# Patient Record
Sex: Male | Born: 1980
Health system: Southern US, Community
[De-identification: ages and names within clinical notes are randomized; demographics above are authoritative.]

## PROBLEM LIST (undated history)

## (undated) DIAGNOSIS — W3400XA Accidental discharge from unspecified firearms or gun, initial encounter: Secondary | ICD-10-CM

---

## 2000-04-25 ENCOUNTER — Emergency Department (HOSPITAL_COMMUNITY): Admission: EM | Admit: 2000-04-25 | Discharge: 2000-04-25 | Payer: Self-pay | Admitting: Emergency Medicine

## 2000-12-07 ENCOUNTER — Emergency Department (HOSPITAL_COMMUNITY): Admission: EM | Admit: 2000-12-07 | Discharge: 2000-12-07 | Payer: Self-pay | Admitting: Internal Medicine

## 2001-01-12 ENCOUNTER — Emergency Department (HOSPITAL_COMMUNITY): Admission: EM | Admit: 2001-01-12 | Discharge: 2001-01-12 | Payer: Self-pay | Admitting: Emergency Medicine

## 2003-11-19 ENCOUNTER — Emergency Department (HOSPITAL_COMMUNITY): Admission: EM | Admit: 2003-11-19 | Discharge: 2003-11-19 | Payer: Self-pay | Admitting: Emergency Medicine

## 2003-11-21 ENCOUNTER — Emergency Department (HOSPITAL_COMMUNITY): Admission: EM | Admit: 2003-11-21 | Discharge: 2003-11-21 | Payer: Self-pay | Admitting: Emergency Medicine

## 2003-11-27 ENCOUNTER — Emergency Department (HOSPITAL_COMMUNITY): Admission: EM | Admit: 2003-11-27 | Discharge: 2003-11-27 | Payer: Self-pay | Admitting: Family Medicine

## 2003-12-24 ENCOUNTER — Emergency Department (HOSPITAL_COMMUNITY): Admission: EM | Admit: 2003-12-24 | Discharge: 2003-12-24 | Payer: Self-pay | Admitting: Internal Medicine

## 2003-12-31 ENCOUNTER — Emergency Department (HOSPITAL_COMMUNITY): Admission: EM | Admit: 2003-12-31 | Discharge: 2003-12-31 | Payer: Self-pay | Admitting: Emergency Medicine

## 2004-01-18 ENCOUNTER — Ambulatory Visit (HOSPITAL_BASED_OUTPATIENT_CLINIC_OR_DEPARTMENT_OTHER): Admission: RE | Admit: 2004-01-18 | Discharge: 2004-01-18 | Payer: Self-pay | Admitting: Otolaryngology

## 2004-01-18 ENCOUNTER — Ambulatory Visit (HOSPITAL_COMMUNITY): Admission: RE | Admit: 2004-01-18 | Discharge: 2004-01-18 | Payer: Self-pay | Admitting: Otolaryngology

## 2004-02-10 ENCOUNTER — Emergency Department (HOSPITAL_COMMUNITY): Admission: EM | Admit: 2004-02-10 | Discharge: 2004-02-10 | Payer: Self-pay

## 2004-03-25 ENCOUNTER — Emergency Department (HOSPITAL_COMMUNITY): Admission: EM | Admit: 2004-03-25 | Discharge: 2004-03-25 | Payer: Self-pay | Admitting: Family Medicine

## 2004-06-01 ENCOUNTER — Emergency Department (HOSPITAL_COMMUNITY): Admission: EM | Admit: 2004-06-01 | Discharge: 2004-06-01 | Payer: Self-pay | Admitting: Family Medicine

## 2004-08-17 ENCOUNTER — Inpatient Hospital Stay (HOSPITAL_COMMUNITY): Admission: AC | Admit: 2004-08-17 | Discharge: 2004-08-22 | Payer: Self-pay

## 2004-08-28 ENCOUNTER — Emergency Department (HOSPITAL_COMMUNITY): Admission: EM | Admit: 2004-08-28 | Discharge: 2004-08-28 | Payer: Self-pay | Admitting: Emergency Medicine

## 2004-08-29 ENCOUNTER — Ambulatory Visit (HOSPITAL_COMMUNITY): Admission: RE | Admit: 2004-08-29 | Discharge: 2004-08-29 | Payer: Self-pay | Admitting: Physician Assistant

## 2004-09-23 ENCOUNTER — Emergency Department (HOSPITAL_COMMUNITY): Admission: EM | Admit: 2004-09-23 | Discharge: 2004-09-23 | Payer: Self-pay | Admitting: Emergency Medicine

## 2004-10-29 ENCOUNTER — Emergency Department (HOSPITAL_COMMUNITY): Admission: EM | Admit: 2004-10-29 | Discharge: 2004-10-29 | Payer: Self-pay | Admitting: Family Medicine
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
## 2010-06-29 ENCOUNTER — Emergency Department (HOSPITAL_COMMUNITY)
Admission: EM | Admit: 2010-06-29 | Discharge: 2010-06-29 | Disposition: A | Discharge status: Home / Self Care | Payer: Self-pay | Attending: Emergency Medicine | Admitting: Emergency Medicine

## 2010-06-29 DIAGNOSIS — Y9229 Other specified public building as the place of occurrence of the external cause: Secondary | ICD-10-CM | POA: Insufficient documentation

## 2010-06-29 DIAGNOSIS — S0990XA Unspecified injury of head, initial encounter: Secondary | ICD-10-CM | POA: Insufficient documentation

## 2010-06-29 DIAGNOSIS — S0180XA Unspecified open wound of other part of head, initial encounter: Secondary | ICD-10-CM | POA: Insufficient documentation

## 2010-07-05 ENCOUNTER — Emergency Department (HOSPITAL_COMMUNITY)
Admission: EM | Admit: 2010-07-05 | Discharge: 2010-07-05 | Disposition: A | Discharge status: Home / Self Care | Payer: Self-pay | Attending: Emergency Medicine | Admitting: Emergency Medicine

## 2010-07-21 ENCOUNTER — Emergency Department (HOSPITAL_COMMUNITY)
Admission: EM | Admit: 2010-07-21 | Discharge: 2010-07-21 | Disposition: A | Discharge status: Home / Self Care | Payer: Self-pay | Attending: Emergency Medicine | Admitting: Emergency Medicine

## 2010-07-21 ENCOUNTER — Emergency Department (HOSPITAL_COMMUNITY): Payer: Self-pay

## 2010-07-21 DIAGNOSIS — Y92009 Unspecified place in unspecified non-institutional (private) residence as the place of occurrence of the external cause: Secondary | ICD-10-CM | POA: Insufficient documentation

## 2010-07-21 DIAGNOSIS — S99929A Unspecified injury of unspecified foot, initial encounter: Secondary | ICD-10-CM | POA: Insufficient documentation

## 2010-07-21 DIAGNOSIS — X500XXA Overexertion from strenuous movement or load, initial encounter: Secondary | ICD-10-CM | POA: Insufficient documentation

## 2010-07-21 DIAGNOSIS — S8990XA Unspecified injury of unspecified lower leg, initial encounter: Secondary | ICD-10-CM | POA: Insufficient documentation

## 2010-07-21 DIAGNOSIS — Y9372 Activity, wrestling: Secondary | ICD-10-CM | POA: Insufficient documentation

## 2010-07-21 DIAGNOSIS — M2390 Unspecified internal derangement of unspecified knee: Secondary | ICD-10-CM | POA: Insufficient documentation

## 2010-07-21 DIAGNOSIS — M25469 Effusion, unspecified knee: Secondary | ICD-10-CM | POA: Insufficient documentation

## 2011-01-06 ENCOUNTER — Emergency Department (HOSPITAL_COMMUNITY)
Admission: EM | Admit: 2011-01-06 | Discharge: 2011-01-07 | Disposition: A | Discharge status: Home / Self Care | Payer: Self-pay | Attending: Emergency Medicine | Admitting: Emergency Medicine

## 2011-01-06 DIAGNOSIS — S01409A Unspecified open wound of unspecified cheek and temporomandibular area, initial encounter: Secondary | ICD-10-CM | POA: Insufficient documentation

## 2011-01-07 ENCOUNTER — Emergency Department (HOSPITAL_COMMUNITY): Payer: Self-pay

## 2018-05-26 ENCOUNTER — Encounter (HOSPITAL_COMMUNITY): Payer: Self-pay | Admitting: Emergency Medicine

## 2018-05-26 ENCOUNTER — Ambulatory Visit (HOSPITAL_COMMUNITY)
Admission: EM | Admit: 2018-05-26 | Discharge: 2018-05-26 | Disposition: A | Discharge status: Home / Self Care | Payer: Self-pay | Attending: Family Medicine | Admitting: Family Medicine

## 2018-05-26 DIAGNOSIS — J111 Influenza due to unidentified influenza virus with other respiratory manifestations: Secondary | ICD-10-CM | POA: Insufficient documentation

## 2018-05-26 MED ORDER — OSELTAMIVIR PHOSPHATE 75 MG PO CAPS: 75.0000 mg | ORAL_CAPSULE | Freq: Two times a day (BID) | ORAL | 0 refills | Status: DC

## 2018-05-26 NOTE — ED Provider Notes (Signed)
[facility]    CSN: [id number] Arrival date & time: 05/26/18  1401     History   Chief Complaint Chief Complaint  Patient presents with  . Flu-Like Symptoms    HPI Adam Phelps is a 38 y.o. male.   Adam Phelps urgent care visit #1 for this 38 year old male who complains of fever, chills, body aches, malaise and fatigue starting yesterday.  Today he states he has a tight feeling when he tries to take a deep breath.    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     History reviewed. No pertinent past medical history.  There are no active problems to display for this patient.   History reviewed. No pertinent surgical history.     Home Medications    Prior to Admission medications   Medication Sig Start Date End Date Taking? Authorizing Provider  oseltamivir (TAMIFLU) 75 MG capsule Take 1 capsule (75 mg total) by mouth every 12 (twelve) hours. 05/26/18   Elvina Sidle, MD    Family History History reviewed. No pertinent family history.  Social History Social History   Tobacco Use  . Smoking status: Never Smoker  . Smokeless tobacco: Never Used  Substance Use Topics  . Alcohol use: Not on file  . Drug use: Not on file     Allergies   Patient has no known allergies.   Review of Systems Review of Systems   Physical Exam Triage Vital Signs ED Triage Vitals  Enc Vitals Group     BP 05/26/18 1434 (!) 123/94     Pulse Rate 05/26/18 1434 89     Resp 05/26/18 1434 18     Temp 05/26/18 1434 98.7 F (37.1 C)     Temp Source 05/26/18 1434 Oral     SpO2 05/26/18 1434 100 %     Weight --      Height --      Head Circumference --      Peak Flow --      Pain Score 05/26/18 1435 2     Pain Loc --      Pain Edu? --      Excl. in GC? --    No data found.  Updated Vital Signs BP (!) 123/94 (BP Location: Left Arm)   Pulse 89   Temp 98.7 F (37.1 C) (Oral)   Resp 18   SpO2 100%    Physical  Exam Vitals signs and nursing note reviewed.  Constitutional:      Appearance: Normal appearance.  HENT:     Head: Normocephalic and atraumatic.     Right Ear: Tympanic membrane normal.     Left Ear: Tympanic membrane normal.     Nose: Nose normal.     Mouth/Throat:     Mouth: Mucous membranes are moist.     Pharynx: Oropharynx is clear.  Eyes:     Conjunctiva/sclera: Conjunctivae normal.  Neck:     Musculoskeletal: Normal range of motion and neck supple.  Cardiovascular:     Rate and Rhythm: Normal rate and regular rhythm.     Heart sounds: Normal heart sounds.  Pulmonary:     Effort: Pulmonary effort is normal.     Breath sounds: Normal breath sounds.  Musculoskeletal: Normal range of motion.  Neurological:     General: No focal deficit present.     Mental Status: He is alert.  Psychiatric:  Mood and Affect: Mood normal.      UC Treatments / Results  Labs (all labs ordered are listed, but only abnormal results are displayed) Labs Reviewed - No data to display  EKG None  Radiology No results found.  Procedures Procedures (including critical care time)  Medications Ordered in UC Medications - No data to display  Initial Impression / Assessment and Plan / UC Course  I have reviewed the triage vital signs and the nursing notes.  Pertinent labs & imaging results that were available during my care of the patient were reviewed by me and considered in my medical decision making (see chart for details).     Final Clinical Impressions(s) / UC Diagnoses   Final diagnoses:  Influenza   Discharge Instructions   None    ED Prescriptions    Medication Sig Dispense Auth. Provider   oseltamivir (TAMIFLU) 75 MG capsule Take 1 capsule (75 mg total) by mouth every 12 (twelve) hours. 10 capsule Elvina Sidle, MD     Controlled Substance Prescriptions [place] Controlled Substance Registry consulted? Not Applicable   Elvina Sidle, MD 05/26/18 [phone]

## 2018-05-26 NOTE — ED Notes (Signed)
Patient able to ambulate independently  

## 2018-05-26 NOTE — ED Triage Notes (Signed)
[facility] fever, chills, body aches, malaise and fatigue starting yesterday.  Today he states he has a tight feeling when he tries to take a deep breath.

## 2019-03-02 ENCOUNTER — Ambulatory Visit (HOSPITAL_COMMUNITY)
Admission: EM | Admit: 2019-03-02 | Discharge: 2019-03-02 | Disposition: A | Discharge status: Home / Self Care | Payer: Self-pay | Attending: Family Medicine | Admitting: Family Medicine

## 2019-03-02 ENCOUNTER — Other Ambulatory Visit: Payer: Self-pay

## 2019-03-02 DIAGNOSIS — S199XXA Unspecified injury of neck, initial encounter: Secondary | ICD-10-CM

## 2019-03-02 MED ORDER — IBUPROFEN 800 MG PO TABS: 800.0000 mg | ORAL_TABLET | Freq: Three times a day (TID) | ORAL | 0 refills | Status: DC
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
## 2019-11-30 ENCOUNTER — Encounter (HOSPITAL_COMMUNITY): Payer: Self-pay

## 2019-11-30 ENCOUNTER — Emergency Department (HOSPITAL_COMMUNITY): Payer: Self-pay

## 2019-11-30 ENCOUNTER — Inpatient Hospital Stay (HOSPITAL_COMMUNITY)
Admission: EM | Admit: 2019-11-30 | Discharge: 2019-12-03 | DRG: 200 | Disposition: A | Discharge status: Home / Self Care | Payer: Self-pay | Attending: General Surgery | Admitting: General Surgery

## 2019-11-30 DIAGNOSIS — S21132A Puncture wound without foreign body of left front wall of thorax without penetration into thoracic cavity, initial encounter: Secondary | ICD-10-CM

## 2019-11-30 DIAGNOSIS — S1980XA Other specified injuries of unspecified part of neck, initial encounter: Secondary | ICD-10-CM | POA: Diagnosis present

## 2019-11-30 DIAGNOSIS — Z23 Encounter for immunization: Secondary | ICD-10-CM

## 2019-11-30 DIAGNOSIS — J942 Hemothorax: Secondary | ICD-10-CM

## 2019-11-30 DIAGNOSIS — J984 Other disorders of lung: Secondary | ICD-10-CM | POA: Diagnosis present

## 2019-11-30 DIAGNOSIS — W3400XA Accidental discharge from unspecified firearms or gun, initial encounter: Secondary | ICD-10-CM

## 2019-11-30 DIAGNOSIS — S271XXA Traumatic hemothorax, initial encounter: Principal | ICD-10-CM | POA: Diagnosis present

## 2019-11-30 DIAGNOSIS — D62 Acute posthemorrhagic anemia: Secondary | ICD-10-CM | POA: Diagnosis present

## 2019-11-30 DIAGNOSIS — J939 Pneumothorax, unspecified: Secondary | ICD-10-CM

## 2019-11-30 DIAGNOSIS — S22059A Unspecified fracture of T5-T6 vertebra, initial encounter for closed fracture: Secondary | ICD-10-CM | POA: Diagnosis present

## 2019-11-30 DIAGNOSIS — R042 Hemoptysis: Secondary | ICD-10-CM | POA: Diagnosis present

## 2019-11-30 DIAGNOSIS — Z7289 Other problems related to lifestyle: Secondary | ICD-10-CM

## 2019-11-30 DIAGNOSIS — Y905 Blood alcohol level of 100-119 mg/100 ml: Secondary | ICD-10-CM | POA: Diagnosis present

## 2019-11-30 DIAGNOSIS — Z20822 Contact with and (suspected) exposure to covid-19: Secondary | ICD-10-CM | POA: Diagnosis present

## 2019-11-30 DIAGNOSIS — S22069A Unspecified fracture of T7-T8 vertebra, initial encounter for closed fracture: Secondary | ICD-10-CM | POA: Diagnosis present

## 2019-11-30 DIAGNOSIS — S2242XA Multiple fractures of ribs, left side, initial encounter for closed fracture: Secondary | ICD-10-CM | POA: Diagnosis present

## 2019-11-30 HISTORY — DX: Accidental discharge from unspecified firearms or gun, initial encounter: W34.00XA

## 2019-11-30 LAB — COMPREHENSIVE METABOLIC PANEL
ALT: 25 U/L (ref 0–44)
AST: 39 U/L (ref 15–41)
Albumin: 4 g/dL (ref 3.5–5.0)
Alkaline Phosphatase: 70 U/L (ref 38–126)
Anion gap: 19 — ABNORMAL HIGH (ref 5–15)
BUN: 12 mg/dL (ref 6–20)
CO2: 17 mmol/L — ABNORMAL LOW (ref 22–32)
Calcium: 9.1 mg/dL (ref 8.9–10.3)
Chloride: 106 mmol/L (ref 98–111)
Creatinine, Ser: 1.86 mg/dL — ABNORMAL HIGH (ref 0.61–1.24)
GFR calc Af Amer: 51 mL/min — ABNORMAL LOW (ref >60)
GFR calc non Af Amer: 44 mL/min — ABNORMAL LOW (ref >60)
Glucose, Bld: 153 mg/dL — ABNORMAL HIGH (ref 70–99)
Potassium: 2.6 mmol/L — CL (ref 3.5–5.1)
Sodium: 142 mmol/L (ref 135–145)
Total Bilirubin: 1 mg/dL (ref 0.3–1.2)
Total Protein: 6.9 g/dL (ref 6.5–8.1)

## 2019-11-30 LAB — CBC
HCT: 41.1 % (ref 39.0–52.0)
Hemoglobin: 13 g/dL (ref 13.0–17.0)
MCH: 33.5 pg (ref 26.0–34.0)
MCHC: 31.6 g/dL (ref 30.0–36.0)
MCV: 105.9 fL — ABNORMAL HIGH (ref 80.0–100.0)
Platelets: 317 10*3/uL (ref 150–400)
RBC: 3.88 MIL/uL — ABNORMAL LOW (ref 4.22–5.81)
RDW: 12.1 % (ref 11.5–15.5)
WBC: 9 10*3/uL (ref 4.0–10.5)
nRBC: 0 % (ref 0.0–0.2)

## 2019-11-30 LAB — ETHANOL: Alcohol, Ethyl (B): 101 mg/dL — ABNORMAL HIGH (ref <10)

## 2019-11-30 LAB — I-STAT CHEM 8, ED
BUN: 8 mg/dL (ref 6–20)
Calcium, Ion: 1.08 mmol/L — ABNORMAL LOW (ref 1.15–1.40)
Chloride: 107 mmol/L (ref 98–111)
Creatinine, Ser: 1.9 mg/dL — ABNORMAL HIGH (ref 0.61–1.24)
Glucose, Bld: 142 mg/dL — ABNORMAL HIGH (ref 70–99)
HCT: 41 % (ref 39.0–52.0)
Hemoglobin: 13.9 g/dL (ref 13.0–17.0)
Potassium: 2.5 mmol/L — CL (ref 3.5–5.1)
Sodium: 145 mmol/L (ref 135–145)
TCO2: 17 mmol/L — ABNORMAL LOW (ref 22–32)

## 2019-11-30 LAB — ABO/RH: ABO/RH(D): O POS

## 2019-11-30 LAB — PROTIME-INR
INR: 1.1 (ref 0.8–1.2)
Prothrombin Time: 13.4 seconds (ref 11.4–15.2)

## 2019-11-30 LAB — LACTIC ACID, PLASMA: Lactic Acid, Venous: 9.2 mmol/L (ref 0.5–1.9)

## 2019-11-30 MED ORDER — ENOXAPARIN SODIUM 40 MG/0.4ML ~~LOC~~ SOLN
40.0000 mg | Freq: Every day | SUBCUTANEOUS | Status: DC
  Administered 2019-12-01 – 2019-12-03 (×3): 40 mg via SUBCUTANEOUS
  Filled 2019-11-30 (×3): qty 0.4

## 2019-11-30 MED ORDER — ACETAMINOPHEN 325 MG PO TABS
650.0000 mg | ORAL_TABLET | ORAL | Status: DC | PRN
  Administered 2019-12-01: 650 mg via ORAL
  Filled 2019-11-30: qty 2

## 2019-11-30 MED ORDER — OXYCODONE HCL 5 MG PO TABS
5.0000 mg | ORAL_TABLET | ORAL | Status: DC | PRN
  Administered 2019-12-02: 5 mg via ORAL
  Filled 2019-11-30 (×2): qty 1

## 2019-11-30 MED ORDER — HYDROMORPHONE HCL 1 MG/ML IJ SOLN
1.0000 mg | INTRAMUSCULAR | Status: DC | PRN
  Administered 2019-11-30 – 2019-12-01 (×2): 1 mg via INTRAVENOUS
  Filled 2019-11-30 (×2): qty 1

## 2019-11-30 MED ORDER — TETANUS-DIPHTH-ACELL PERTUSSIS 5-2.5-18.5 LF-MCG/0.5 IM SUSP
0.5000 mL | Freq: Once | INTRAMUSCULAR | Status: AC
  Administered 2019-11-30: 0.5 mL via INTRAMUSCULAR

## 2019-11-30 MED ORDER — OXYCODONE HCL 5 MG PO TABS
10.0000 mg | ORAL_TABLET | ORAL | Status: DC | PRN
  Administered 2019-12-01 – 2019-12-03 (×8): 10 mg via ORAL
  Filled 2019-11-30 (×10): qty 2

## 2019-11-30 MED ORDER — IOHEXOL 300 MG/ML  SOLN
100.0000 mL | Freq: Once | INTRAMUSCULAR | Status: AC | PRN
  Administered 2019-11-30: 100 mL via INTRAVENOUS

## 2019-11-30 MED ORDER — ONDANSETRON HCL 4 MG/2ML IJ SOLN
4.0000 mg | Freq: Four times a day (QID) | INTRAMUSCULAR | Status: DC | PRN
  Administered 2019-11-30 – 2019-12-01 (×2): 4 mg via INTRAVENOUS
  Filled 2019-11-30 (×2): qty 2

## 2019-11-30 MED ORDER — SODIUM CHLORIDE 0.9 % IV SOLN
1000.0000 mL | INTRAVENOUS | Status: DC
  Administered 2019-11-30: 1000 mL via INTRAVENOUS

## 2019-11-30 MED ORDER — ONDANSETRON 4 MG PO TBDP: 4.0000 mg | ORAL_TABLET | Freq: Four times a day (QID) | ORAL | Status: DC | PRN

## 2019-11-30 MED ORDER — SODIUM CHLORIDE 0.9 % IV BOLUS (SEPSIS)
500.0000 mL | Freq: Once | INTRAVENOUS | Status: AC
  Administered 2019-11-30: 500 mL via INTRAVENOUS

## 2019-11-30 MED ORDER — LACTATED RINGERS IV SOLN: INTRAVENOUS | Status: DC

## 2019-11-30 NOTE — ED Notes (Signed)
Critical result, K-2.5 on ISTAT reported to Donnald Garre, MD.
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
## 2019-12-01 ENCOUNTER — Inpatient Hospital Stay (HOSPITAL_COMMUNITY): Payer: Self-pay

## 2019-12-01 LAB — CBC
HCT: 31.6 % — ABNORMAL LOW (ref 39.0–52.0)
HCT: 31.4 % — ABNORMAL LOW (ref 39.0–52.0)
Hemoglobin: 10.1 g/dL — ABNORMAL LOW (ref 13.0–17.0)
Hemoglobin: 10 g/dL — ABNORMAL LOW (ref 13.0–17.0)
MCH: 32.5 pg (ref 26.0–34.0)
MCH: 32.4 pg (ref 26.0–34.0)
MCHC: 32 g/dL (ref 30.0–36.0)
MCHC: 31.8 g/dL (ref 30.0–36.0)
MCV: 101.6 fL — ABNORMAL HIGH (ref 80.0–100.0)
MCV: 101.6 fL — ABNORMAL HIGH (ref 80.0–100.0)
Platelets: 230 10*3/uL (ref 150–400)
Platelets: 218 10*3/uL (ref 150–400)
RBC: 3.11 MIL/uL — ABNORMAL LOW (ref 4.22–5.81)
RBC: 3.09 MIL/uL — ABNORMAL LOW (ref 4.22–5.81)
RDW: 12 % (ref 11.5–15.5)
RDW: 11.6 % (ref 11.5–15.5)
WBC: 12.1 10*3/uL — ABNORMAL HIGH (ref 4.0–10.5)
WBC: 9.9 10*3/uL (ref 4.0–10.5)
nRBC: 0 % (ref 0.0–0.2)
nRBC: 0 % (ref 0.0–0.2)

## 2019-12-01 LAB — TYPE AND SCREEN
ABO/RH(D): O POS
Antibody Screen: NEGATIVE

## 2019-12-01 LAB — TRAUMA TEG PANEL
CFF Max Amplitude: 20.7 mm (ref 15–32)
Citrated Kaolin (R): 3.4 min — ABNORMAL LOW (ref 4.6–9.1)
Citrated Rapid TEG (MA): 58.6 mm (ref 52–70)
Lysis at 30 Minutes: 1.3 % (ref 0.0–2.6)

## 2019-12-01 LAB — SARS CORONAVIRUS 2 BY RT PCR (HOSPITAL ORDER, PERFORMED IN ~~LOC~~ HOSPITAL LAB): SARS Coronavirus 2: NEGATIVE

## 2019-12-01 LAB — BASIC METABOLIC PANEL
Anion gap: 5 (ref 5–15)
BUN: 11 mg/dL (ref 6–20)
CO2: 25 mmol/L (ref 22–32)
Calcium: 8.4 mg/dL — ABNORMAL LOW (ref 8.9–10.3)
Chloride: 106 mmol/L (ref 98–111)
Creatinine, Ser: 1.17 mg/dL (ref 0.61–1.24)
GFR calc Af Amer: >60 mL/min (ref >60)
GFR calc non Af Amer: >60 mL/min (ref >60)
Glucose, Bld: 130 mg/dL — ABNORMAL HIGH (ref 70–99)
Potassium: 4.1 mmol/L (ref 3.5–5.1)
Sodium: 136 mmol/L (ref 135–145)

## 2019-12-01 LAB — HIV ANTIBODY (ROUTINE TESTING W REFLEX): HIV Screen 4th Generation wRfx: NONREACTIVE

## 2019-12-01 MED ORDER — HYDROMORPHONE HCL 1 MG/ML IJ SOLN
0.5000 mg | INTRAMUSCULAR | Status: DC | PRN
  Administered 2019-12-01 – 2019-12-02 (×2): 0.5 mg via INTRAVENOUS
  Filled 2019-12-01 (×2): qty 1

## 2019-12-01 MED ORDER — ACETAMINOPHEN 500 MG PO TABS
1000.0000 mg | ORAL_TABLET | Freq: Four times a day (QID) | ORAL | Status: DC
  Administered 2019-12-01 – 2019-12-03 (×7): 1000 mg via ORAL
  Filled 2019-12-01 (×8): qty 2

## 2019-12-01 NOTE — Progress Notes (Signed)
Admitted to 6n21 from ED at this time. C/o left shoulder pain and nausea.. Moved self from stretcher to bed. Will continue to monitor.
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
## 2019-12-02 ENCOUNTER — Inpatient Hospital Stay (HOSPITAL_COMMUNITY): Payer: Self-pay

## 2019-12-02 ENCOUNTER — Other Ambulatory Visit: Payer: Self-pay

## 2019-12-02 LAB — CBC
HCT: 27.6 % — ABNORMAL LOW (ref 39.0–52.0)
Hemoglobin: 9.2 g/dL — ABNORMAL LOW (ref 13.0–17.0)
MCH: 33.6 pg (ref 26.0–34.0)
MCHC: 33.3 g/dL (ref 30.0–36.0)
MCV: 100.7 fL — ABNORMAL HIGH (ref 80.0–100.0)
Platelets: 212 10*3/uL (ref 150–400)
RBC: 2.74 MIL/uL — ABNORMAL LOW (ref 4.22–5.81)
RDW: 11.7 % (ref 11.5–15.5)
WBC: 8.6 10*3/uL (ref 4.0–10.5)
nRBC: 0 % (ref 0.0–0.2)

## 2019-12-02 LAB — BASIC METABOLIC PANEL
Anion gap: 9 (ref 5–15)
BUN: 7 mg/dL (ref 6–20)
CO2: 25 mmol/L (ref 22–32)
Calcium: 8.6 mg/dL — ABNORMAL LOW (ref 8.9–10.3)
Chloride: 104 mmol/L (ref 98–111)
Creatinine, Ser: 1.05 mg/dL (ref 0.61–1.24)
GFR calc Af Amer: >60 mL/min (ref >60)
GFR calc non Af Amer: >60 mL/min (ref >60)
Glucose, Bld: 97 mg/dL (ref 70–99)
Potassium: 3.3 mmol/L — ABNORMAL LOW (ref 3.5–5.1)
Sodium: 138 mmol/L (ref 135–145)

## 2019-12-02 MED ORDER — HYDROMORPHONE HCL 1 MG/ML IJ SOLN
0.5000 mg | Freq: Three times a day (TID) | INTRAMUSCULAR | Status: DC | PRN
  Administered 2019-12-02 – 2019-12-03 (×2): 0.5 mg via INTRAVENOUS
  Filled 2019-12-02 (×2): qty 1

## 2019-12-02 NOTE — Discharge Instructions (Signed)
Pneumothorax A pneumothorax is commonly called a collapsed lung. It is a condition in which air leaks from a lung and builds up between the thin layer of tissue that covers the lungs (visceral pleura) and the interior wall of the chest cavity (parietal pleura). The air gets trapped outside the lung, between the lung and the chest wall (pleural space). The air takes up space and prevents the lung from fully expanding. This condition sometimes occurs suddenly with no apparent cause. The buildup of air may be small or large. A small pneumothorax may go away on its own. A large pneumothorax will require treatment and hospitalization. What are the causes? This condition may be caused by:  Trauma and injury to the chest wall.  Surgery and other medical procedures.  A complication of an underlying lung problem, especially chronic obstructive pulmonary disease (COPD) or emphysema. Sometimes the cause of this condition is not known. What increases the risk? You are more likely to develop this condition if:  You have an underlying lung problem.  You smoke.  You are 20-40 years old, male, tall, and underweight.  You have a personal or family history of pneumothorax.  You have an eating disorder (anorexia nervosa). This condition can also happen quickly, even in people with no history of lung problems. What are the signs or symptoms? Sometimes a pneumothorax will have no symptoms. When symptoms are present, they can include:  Chest pain.  Shortness of breath.  Increased rate of breathing.  Bluish color to your lips or skin (cyanosis). How is this diagnosed? This condition may be diagnosed by:  A medical history and physical exam.  A chest X-ray, chest CT scan, or ultrasound. How is this treated? Treatment depends on how severe your condition is. The goal of treatment is to remove the extra air and allow your lung to expand back to its normal size.  For a small pneumothorax: ? No  treatment may be needed. ? Extra oxygen is sometimes used to make it go away more quickly.  For a large pneumothorax or a pneumothorax that is causing symptoms, a procedure is done to drain the air from your lungs. To do this, a health care provider may use: ? A needle with a syringe. This is used to suck air from a pleural space where no additional leakage is taking place. ? A chest tube. This is used to suck air where there is ongoing leakage into the pleural space. The chest tube may need to remain in place for several days until the air leak has healed.  In more severe cases, surgery may be needed to repair the damage that is causing the leak.  If you have multiple pneumothorax episodes or have an air leak that will not heal, a procedure called a pleurodesis may be done. A medicine is placed in the pleural space to irritate the tissues around the lung so that the lung will stick to the chest wall, seal any leaks, and stop any buildup of air in that space. If you have an underlying lung problem, severe symptoms, or a large pneumothorax you will usually need to stay in the hospital. Follow these instructions at home: Lifestyle  Do not use any products that contain nicotine or tobacco, such as cigarettes and e-cigarettes. These are major risk factors in pneumothorax. If you need help quitting, ask your health care provider.  Do not lift anything that is heavier than 10 lb (4.5 kg), or the limit that your health care   provider tells you, until he or she says that it is safe.  Avoid activities that take a lot of effort (strenuous) for as long as told by your health care provider.  Return to your normal activities as told by your health care provider. Ask your health care provider what activities are safe for you.  Do not fly in an airplane or scuba dive until your health care provider says it is okay. General instructions  Take over-the-counter and prescription medicines only as told by your  health care provider.  If a cough or pain makes it difficult for you to sleep at night, try sleeping in a semi-upright position in a recliner or by using 2 or 3 pillows.  If you had a chest tube and it was removed, ask your health care provider when you can remove the bandage (dressing). While the dressing is in place, do not allow it to get wet.  Keep all follow-up visits as told by your health care provider. This is important. Contact a health care provider if:  You cough up thick mucus (sputum) that is yellow or green in color.  You were treated with a chest tube, and you have redness, increasing pain, or discharge at the site where it was placed. Get help right away if:  You have increasing chest pain or shortness of breath.  You have a cough that will not go away.  You begin coughing up blood.  You have pain that is getting worse or is not controlled with medicines.  The site where your chest tube was located opens up.  You feel air coming out of the site where the chest tube was placed.  You have a fever or persistent symptoms for more than 2-3 days.  You have a fever and your symptoms suddenly get worse. These symptoms may represent a serious problem that is an emergency. Do not wait to see if the symptoms will go away. Get medical help right away. Call your local emergency services (911 in the [place] Do not drive yourself to the hospital. Summary  A pneumothorax, commonly called a collapsed lung, is a condition in which air leaks from a lung and gets trapped between the lung and the chest wall (pleural space).  The buildup of air may be small or large. A small pneumothorax may go away on its own. A large pneumothorax will require treatment and hospitalization.  Treatment for this condition depends on how severe the pneumothorax is. The goal of treatment is to remove the extra air and allow the lung to expand back to its normal size. This information is not intended to  replace advice given to you by your health care provider. Make sure you discuss any questions you have with your health care provider. Document Revised: 04/03/2017 Document Reviewed: 03/30/2017 Elsevier Patient Education  2020 [employer]  Rib Fracture  A rib fracture is a break or crack in one of the bones of the ribs. The ribs are like a cage that goes around your upper chest. A broken or cracked rib is often painful, but most do not cause other problems. Most rib fractures usually heal on their own in 1-3 months. Follow these instructions at home: Managing pain, stiffness, and swelling  If directed, apply ice to the injured area. ? Put ice in a plastic bag. ? Place a towel between your skin and the bag. ? Leave the ice on for 20 minutes, 2-3 times a day.  Take over-the-counter and prescription   medicines only as told by your doctor. Activity  Avoid activities that cause pain to the injured area. Protect your injured area.  Slowly increase activity as told by your doctor. General instructions  Do deep breathing as told by your doctor. You may be told to: ? Take deep breaths many times a day. ? Cough many times a day while hugging a pillow. ? Use a device (incentive spirometer) to do deep breathing many times a day.  Drink enough fluid to keep your pee (urine) clear or pale yellow.  Do not wear a rib belt or binder. These do not allow you to breathe deeply.  Keep all follow-up visits as told by your doctor. This is important. Contact a doctor if:  You have a fever. Get help right away if:  You have trouble breathing.  You are short of breath.  You cannot stop coughing.  You cough up thick or bloody spit (sputum).  You feel sick to your stomach (nauseous), throw up (vomit), or have belly (abdominal) pain.  Your pain gets worse and medicine does not help. Summary  A rib fracture is a break or crack in one of the bones of the ribs.  Apply ice to the injured area  and take medicines for pain as told by your doctor.  Take deep breaths and cough many times a day. Hug a pillow every time you cough. This information is not intended to replace advice given to you by your health care provider. Make sure you discuss any questions you have with your health care provider. Document Revised: 04/03/2017 Document Reviewed: 07/22/2016 Elsevier Patient Education  2020 Elsevier Inc.  
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
## 2019-12-03 ENCOUNTER — Inpatient Hospital Stay (HOSPITAL_COMMUNITY): Payer: Self-pay

## 2019-12-03 MED ORDER — OXYCODONE HCL 5 MG PO TABS: 5.0000 mg | ORAL_TABLET | Freq: Four times a day (QID) | ORAL | 0 refills | Status: DC | PRN

## 2019-12-03 MED ORDER — ACETAMINOPHEN 500 MG PO TABS: 1000.0000 mg | ORAL_TABLET | Freq: Four times a day (QID) | ORAL | 0 refills | Status: DC | PRN
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
## 2019-12-05 ENCOUNTER — Encounter (HOSPITAL_COMMUNITY): Payer: Self-pay | Admitting: Emergency Medicine
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
## 2020-04-27 ENCOUNTER — Emergency Department: Admit: 2020-04-27 | Discharge status: Absent without leave | Payer: Self-pay

## 2020-04-27 ENCOUNTER — Encounter: Payer: Self-pay | Admitting: Family Medicine

## 2020-04-27 ENCOUNTER — Emergency Department (INDEPENDENT_AMBULATORY_CARE_PROVIDER_SITE_OTHER)
Admission: EM | Admit: 2020-04-27 | Discharge: 2020-04-27 | Disposition: A | Discharge status: Home / Self Care | Payer: Self-pay | Source: Home / Self Care

## 2020-04-27 ENCOUNTER — Other Ambulatory Visit: Payer: Self-pay

## 2020-04-27 DIAGNOSIS — L03011 Cellulitis of right finger: Secondary | ICD-10-CM

## 2020-04-27 MED ORDER — DOXYCYCLINE HYCLATE 100 MG PO TABS: 100.0000 mg | ORAL_TABLET | Freq: Two times a day (BID) | ORAL | 0 refills | Status: DC

## 2020-04-27 NOTE — ED Triage Notes (Signed)
Middle RT finger cut a few days ago and it's infected.

## 2020-04-27 NOTE — Discharge Instructions (Signed)
Keep soaking the finger every 2 hours with warm soapy water, and gently massage the area to keep the pus coming out.  Please come back Sunday if you are still having significant swelling.
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
## 2020-09-18 ENCOUNTER — Emergency Department (HOSPITAL_COMMUNITY): Payer: Self-pay

## 2020-09-18 ENCOUNTER — Other Ambulatory Visit: Payer: Self-pay

## 2020-09-18 ENCOUNTER — Encounter (HOSPITAL_COMMUNITY): Payer: Self-pay

## 2020-09-18 ENCOUNTER — Emergency Department (HOSPITAL_COMMUNITY)
Admission: EM | Admit: 2020-09-18 | Discharge: 2020-09-18 | Disposition: A | Discharge status: Home / Self Care | Payer: Self-pay | Attending: Emergency Medicine | Admitting: Emergency Medicine

## 2020-09-18 DIAGNOSIS — W228XXA Striking against or struck by other objects, initial encounter: Secondary | ICD-10-CM | POA: Insufficient documentation

## 2020-09-18 DIAGNOSIS — S0181XA Laceration without foreign body of other part of head, initial encounter: Secondary | ICD-10-CM | POA: Insufficient documentation

## 2020-09-18 DIAGNOSIS — F17200 Nicotine dependence, unspecified, uncomplicated: Secondary | ICD-10-CM | POA: Insufficient documentation

## 2020-09-18 DIAGNOSIS — Y93K9 Activity, other involving animal care: Secondary | ICD-10-CM | POA: Insufficient documentation

## 2020-09-18 MED ORDER — ONDANSETRON 4 MG PO TBDP
4.0000 mg | ORAL_TABLET | Freq: Once | ORAL | Status: AC
Start: 2020-09-18 — End: 2020-09-18
  Administered 2020-09-18: 4 mg via ORAL
  Filled 2020-09-18: qty 1

## 2020-09-18 MED ORDER — LIDOCAINE-EPINEPHRINE 2 %-1:100000 IJ SOLN
20.0000 mL | Freq: Once | INTRAMUSCULAR | Status: AC
  Administered 2020-09-18: 20 mL
  Filled 2020-09-18: qty 1

## 2020-09-18 MED ORDER — ACETAMINOPHEN 500 MG PO TABS: 1000.0000 mg | ORAL_TABLET | Freq: Four times a day (QID) | ORAL | 0 refills | Status: DC | PRN

## 2020-09-18 NOTE — ED Notes (Signed)
An After Visit Summary was printed and given to the patient. Discharge instructions given and no further questions at this time.  Pt leaving with wife.  
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
## 2020-09-18 NOTE — ED Notes (Signed)
Pt yelling at staff to help him, pt screaming at staff that he is bleeding to death. Pt lac to forehead not currently bleeding. Pt asked to please stay seated and provider will be with him shortly.

## 2020-09-18 NOTE — ED Notes (Signed)
Patient transported to CT 

## 2020-09-18 NOTE — Discharge Instructions (Addendum)
You suffered a laceration to your forehead that was sutured by me.  Continue to apply ice ice pack to area for 15 to 20 minutes each time, several times daily to help decrease discomfort and swelling.  Sutures will need to be removed in 5 days.  CT scan of the head did not show any broken skull or blood in your brain.  Take Tylenol as needed for headache.

## 2020-09-18 NOTE — ED Triage Notes (Addendum)
Pt has small lac to forehead, left side. Pt moving around and walking around in room, moving his head around, bleeding has increased due to patients movements. Pt A&Ox4. Pt very anxious and not answering the rest of this RN's questions.

## 2020-09-23 ENCOUNTER — Telehealth (HOSPITAL_COMMUNITY): Payer: Self-pay

## 2020-09-23 NOTE — Telephone Encounter (Signed)
Pt called concerning about having sutures removed tomorrow due to he was out of town. Staff informed patient that he can go to any urgent care to have sutures removed.

## 2021-03-20 ENCOUNTER — Ambulatory Visit (HOSPITAL_COMMUNITY): Payer: Self-pay | Admitting: Licensed Clinical Social Worker

## 2021-04-03 ENCOUNTER — Ambulatory Visit (HOSPITAL_COMMUNITY): Payer: Self-pay | Admitting: Physician Assistant

## 2021-07-18 ENCOUNTER — Other Ambulatory Visit (HOSPITAL_COMMUNITY)
Admission: RE | Admit: 2021-07-18 | Discharge: 2021-07-18 | Disposition: A | Discharge status: Home / Self Care | Payer: Self-pay | Source: Ambulatory Visit | Attending: Physician Assistant | Admitting: Physician Assistant

## 2021-07-18 ENCOUNTER — Other Ambulatory Visit: Payer: Self-pay

## 2021-07-18 ENCOUNTER — Encounter: Payer: Self-pay | Admitting: Physician Assistant

## 2021-07-18 ENCOUNTER — Ambulatory Visit (INDEPENDENT_AMBULATORY_CARE_PROVIDER_SITE_OTHER): Payer: Self-pay | Admitting: Physician Assistant

## 2021-07-18 DIAGNOSIS — A599 Trichomoniasis, unspecified: Secondary | ICD-10-CM | POA: Insufficient documentation

## 2021-07-18 DIAGNOSIS — Z202 Contact with and (suspected) exposure to infections with a predominantly sexual mode of transmission: Secondary | ICD-10-CM | POA: Insufficient documentation

## 2021-07-18 MED ORDER — CEFTRIAXONE SODIUM 500 MG IJ SOLR
500.0000 mg | Freq: Once | INTRAMUSCULAR | Status: AC
  Administered 2021-07-18: 500 mg via INTRAMUSCULAR

## 2021-07-18 MED ORDER — METRONIDAZOLE 500 MG PO TABS: 500.0000 mg | ORAL_TABLET | Freq: Two times a day (BID) | ORAL | 0 refills | Status: AC

## 2021-07-18 MED ORDER — DOXYCYCLINE HYCLATE 100 MG PO CAPS: 100.0000 mg | ORAL_CAPSULE | Freq: Two times a day (BID) | ORAL | 0 refills | Status: DC
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
## 2021-07-18 NOTE — Patient Instructions (Signed)
You are going to take metronidazole twice a day for the next 7 days for the trichomonas, you are going to take doxycycline twice a day for the next 10 days for the chlamydia.  You received an injection today for the gonorrhea. ? ?We will call you with today's lab results. ? ?Kennieth Rad, PA-C ?Physician Assistant ?[facility] ?[email or web address] ? ? ?Gonorrhea ?Gonorrhea is an STI (sexually transmitted infection) that can infect both men and women. If left untreated, this infection can: ?Damage the male or male reproductive organs. ?Cause women and men to be unable to have children (infertility). ?Harm an unborn baby if an infected woman is pregnant. ?It is important to get treatment for gonorrhea as soon as possible. All of your sex partners may also need to be treated for the infection. ?What are the causes? ?This condition is caused by bacteria called Neisseria gonorrhoeae. The infection is spread from person to person through sexual contact, including oral, anal, and vaginal sex. A newborn can get the infection from his or her mother during birth. ?What increases the risk? ?The following factors may make you more likely to develop this condition: ?Being a woman who is younger than age 68 and who is sexually active. ?Being a man who is gay or bisexual and who has sex with men. ?Having a new sex partner or having multiple partners. ?Having a sex partner who has an STI. ?Not using condoms correctly or not using condoms every time you have sex. ?Having a history of STIs. ?Exchanging sex for money or drugs. ?What are the signs or symptoms? ?When symptoms occur, they may be different for women and men. Symptoms may include: ?Abnormal discharge from the penis or vagina. The discharge may be cloudy, thick, or yellow-green in color. ?Pain or burning when you urinate. ?Irritation, pain, bleeding, or discharge from the rectum. This may occur if the infection was  spread by anal sex. ?Sore throat or swollen lymph nodes in the neck. This may occur if the infection was spread by oral sex. ?Pain or swelling in the testicles, in men. ?Bleeding between menstrual periods, in women. ?In some cases, there are no symptoms. ?How is this diagnosed? ?This condition is diagnosed based on: ?A physical exam. ?A sample of discharge that is looked at under a microscope to find any bacteria. The discharge may be taken from the penis, vagina, throat, or rectum. ?Urine tests. ?Not all test results will be available during your visit. ?How is this treated? ?This condition is treated with antibiotic medicines. It is important to start treatment as soon as possible. Early treatment may prevent some problems from developing. You should not have sex during treatment. All types of sexual activity should be avoided for at least 7 days after treatment is complete and until your sex partner or partners have been treated. ?Follow these instructions at home: ?Take over-the-counter and prescription medicines as told by your health care provider. Finish all antibiotic medicine even when you start to feel better. ?Do not have sex during treatment. ?Do not have sex until at least 7 days after you and your partner or partners have finished treatment and your health care provider says it is okay. ?It is up to you to get your test results. Ask your health care provider, or the department that is doing the test, when your results will be ready. ?If you get a positive result on your gonorrhea test, tell your recent sex partners. These include any partners for oral,  anal, or vaginal sex. They need to be checked for gonorrhea even if they do not have symptoms. They may need treatment, even if they get negative results on their gonorrhea tests. ?Drink enough fluid to keep your urine pale yellow. ?Keep all follow-up visits as told by your health care provider. This is important. ?How is this prevented? ? ?Use latex  condoms correctly every time you have sex. ?Ask if your sex partner or partners have been tested for STIs and had negative results. ?Avoid having multiple sex partners. ?Get regular health screenings to check for STIs. ?Contact a health care provider if: ?Your symptoms do not get better after a few days of taking antibiotics. ?Your symptoms get worse. ?You develop new symptoms, including: ?Eye irritation. ?Joint pain or swelling. ?Unusual rashes. ?You have a fever. ?Summary ?Gonorrhea is an STI (sexually transmitted infection) that can infect both men and women. ?This infection is spread from person to person through sexual contact, including oral, anal, and vaginal sex. A newborn can get the infection from his or her mother during birth. ?Symptoms include abnormal discharge, pain or burning while urinating, or pain in the rectum. ?This condition is treated with antibiotic medicines. Do not have sex until at least 7 days after both you and any sex partners have completed antibiotic treatment. ?Tell your health care provider if your symptoms get worse or you have new symptoms. ?This information is not intended to replace advice given to you by your health care provider. Make sure you discuss any questions you have with your health care provider. ?Document Revised: 04/14/2019 Document Reviewed: 04/15/2019 ?Elsevier Patient Education ? [place]. ? ?

## 2021-07-22 ENCOUNTER — Telehealth: Payer: Self-pay | Admitting: Physician Assistant

## 2021-07-22 ENCOUNTER — Telehealth: Payer: Self-pay | Admitting: *Deleted

## 2021-07-22 LAB — URINE CYTOLOGY ANCILLARY ONLY
Bacterial Vaginitis (gardnerella): NEGATIVE
Candida Glabrata: NEGATIVE
Candida Vaginitis: NEGATIVE
Chlamydia: NEGATIVE
Comment: NEGATIVE
Comment: NEGATIVE
Comment: NEGATIVE
Comment: NEGATIVE
Comment: NEGATIVE
Comment: NORMAL
Neisseria Gonorrhea: POSITIVE — AB
Trichomonas: NEGATIVE

## 2021-07-22 NOTE — Telephone Encounter (Signed)
Patient verified DOB ?Patient is aware of STI showing positive for gonorrhea and negative for all other STI. Patient denies any odor, discomfort or discharge. ? ?

## 2021-07-22 NOTE — Telephone Encounter (Signed)
-----   Message from Roney Jaffe, [place] sent at 07/22/2021  1:23 PM EDT ----- ?Please call patient and let him know that his cytology was positive for gonorrhea, negative for chlamydia, and trichomonas.  He did receive an injection in the clinic for the gonorrhea, it is not necessary for him to complete the antibiotic courses that were prescribed.  It is not necessary for him to have a test of cure as long as his symptoms have resolved. ?

## 2021-11-18 NOTE — Telephone Encounter (Signed)
error 

## 2022-01-06 ENCOUNTER — Telehealth: Payer: Self-pay

## 2022-02-06 ENCOUNTER — Ambulatory Visit
Admission: EM | Admit: 2022-02-06 | Discharge: 2022-02-06 | Disposition: A | Discharge status: Home / Self Care | Payer: Self-pay | Attending: Urgent Care | Admitting: Urgent Care

## 2022-02-06 DIAGNOSIS — S134XXA Sprain of ligaments of cervical spine, initial encounter: Secondary | ICD-10-CM

## 2022-02-06 DIAGNOSIS — M542 Cervicalgia: Secondary | ICD-10-CM

## 2022-02-06 DIAGNOSIS — M549 Dorsalgia, unspecified: Secondary | ICD-10-CM

## 2022-02-06 MED ORDER — NAPROXEN 500 MG PO TABS: 500.0000 mg | ORAL_TABLET | Freq: Two times a day (BID) | ORAL | 0 refills | Status: AC

## 2022-02-06 MED ORDER — CYCLOBENZAPRINE HCL 5 MG PO TABS: 5.0000 mg | ORAL_TABLET | Freq: Three times a day (TID) | ORAL | 0 refills | Status: AC | PRN
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
## 2022-05-19 ENCOUNTER — Other Ambulatory Visit: Payer: Self-pay

## 2022-05-19 ENCOUNTER — Emergency Department
Admission: EM | Admit: 2022-05-19 | Discharge: 2022-05-19 | Disposition: A | Discharge status: Home / Self Care | Payer: Self-pay | Attending: Emergency Medicine | Admitting: Emergency Medicine

## 2022-05-19 ENCOUNTER — Encounter: Payer: Self-pay | Admitting: Emergency Medicine

## 2022-05-19 DIAGNOSIS — Z202 Contact with and (suspected) exposure to infections with a predominantly sexual mode of transmission: Secondary | ICD-10-CM | POA: Insufficient documentation

## 2022-05-19 MED ORDER — DOXYCYCLINE MONOHYDRATE 100 MG PO TABS: 100.0000 mg | ORAL_TABLET | Freq: Two times a day (BID) | ORAL | 0 refills | Status: AC

## 2022-05-19 MED ORDER — CEFTRIAXONE SODIUM 1 G IJ SOLR
500.0000 mg | Freq: Once | INTRAMUSCULAR | Status: AC
  Administered 2022-05-19: 500 mg via INTRAMUSCULAR
  Filled 2022-05-19: qty 10
